# Patient Record
Sex: Female | Born: 1965 | Race: White | Hispanic: No | Marital: Single | Smoking: Never smoker
Health system: Southern US, Community
[De-identification: ages and names within clinical notes are randomized; demographics above are authoritative.]

## PROBLEM LIST (undated history)

## (undated) DIAGNOSIS — Z85850 Personal history of malignant neoplasm of thyroid: Secondary | ICD-10-CM

## (undated) HISTORY — PX: THYROID SURGERY: SHX805

## (undated) HISTORY — PX: TUBAL LIGATION: SHX77

---

## 2005-03-01 ENCOUNTER — Ambulatory Visit: Payer: Self-pay | Admitting: Obstetrics and Gynecology

## 2006-07-10 ENCOUNTER — Ambulatory Visit: Payer: Self-pay | Admitting: Otolaryngology

## 2006-08-05 ENCOUNTER — Inpatient Hospital Stay: Payer: Self-pay | Admitting: Otolaryngology

## 2006-08-26 ENCOUNTER — Inpatient Hospital Stay (HOSPITAL_COMMUNITY): Admission: AD | Admit: 2006-08-26 | Discharge: 2006-08-28 | Payer: Self-pay | Admitting: Endocrinology

## 2008-03-04 ENCOUNTER — Emergency Department: Payer: Self-pay | Admitting: Emergency Medicine

## 2008-03-12 ENCOUNTER — Ambulatory Visit: Payer: Self-pay

## 2008-04-19 ENCOUNTER — Ambulatory Visit: Payer: Self-pay | Admitting: Obstetrics and Gynecology

## 2010-11-05 ENCOUNTER — Ambulatory Visit: Payer: Self-pay | Admitting: Internal Medicine

## 2011-07-23 ENCOUNTER — Ambulatory Visit: Payer: Self-pay | Admitting: Obstetrics and Gynecology

## 2012-08-19 ENCOUNTER — Ambulatory Visit: Payer: Self-pay | Admitting: Anesthesiology

## 2012-08-27 ENCOUNTER — Ambulatory Visit: Payer: Self-pay | Admitting: Orthopedic Surgery

## 2012-10-14 ENCOUNTER — Ambulatory Visit: Payer: Self-pay | Admitting: Family Medicine

## 2012-10-14 LAB — DOT URINE DIP: Specific Gravity: 1.005 (ref 1.003–1.030)

## 2012-10-15 ENCOUNTER — Encounter: Payer: Self-pay | Admitting: Orthopedic Surgery

## 2012-11-03 ENCOUNTER — Encounter: Payer: Self-pay | Admitting: Orthopedic Surgery

## 2013-12-13 ENCOUNTER — Ambulatory Visit: Payer: Self-pay | Admitting: Emergency Medicine

## 2013-12-13 LAB — DOT URINE DIP
Blood: NEGATIVE
Glucose,UR: NEGATIVE mg/dL (ref 0–75)
PROTEIN: NEGATIVE
Specific Gravity: 1.005 (ref 1.003–1.030)

## 2014-08-26 NOTE — Op Note (Signed)
PATIENT NAME:  Christina Cross, Christina Cross MR#:  [id number] DATE OF BIRTH:  07-03-1965  DATE OF PROCEDURE:  08/27/2012  PREOPERATIVE DIAGNOSIS:  Right carpal tunnel syndrome.   POSTOPERATIVE DIAGNOSIS:  Right carpal tunnel syndrome.  PROCEDURE:  Right carpal tunnel release.   ANESTHESIA:  General.  SURGEON:  Leitha SchullerMichael J. Elizabethanne Lusher, MD  DESCRIPTION OF PROCEDURE:  The patient was brought to the Operating Room, and after adequate anesthesia was obtained, the right arm was prepped and draped in the usual sterile fashion, with a tourniquet applied to the upper arm. After prepping and draping in the usual sterile fashion, appropriate patient identification and timeout procedures were completed. The tourniquet was raised to 250 mmHg. An incision was made in line with the ring metacarpal. There was a hypoplastic thumb and some abnormal palmar creases, so the incision was made just in line with the ring metacarpal. The subcutaneous tissue was spread, and the distal end of the carpal tunnel was identified, a hemostat placed underneath it, and resection carried out proximally. The transcarpal ligament was very thick, especially in its midportion, which appeared to be the area of compression of about a 2 cm area, with a markedly thickened transverse carpal ligament. Getting to the wrist flexion crease, there did not appear to be any further compression in the antebrachial fascia. After adequate release, there appeared to be good vascular blush to the nerve. There were no masses within the carpal tunnel. The wound was irrigated and then closed with simple interrupted 4-0 nylon skin sutures, and 10 mL of 0.5% Sensorcaine without epinephrine was infiltrated for postop analgesia. Sterile dressings of Xeroform, 4 x 4s, Webril and Ace wrap were applied. The patient was sent to the Recovery Room in stable condition.   ESTIMATED BLOOD LOSS:  Minimal.   COMPLICATIONS:  None.   SPECIMEN:  None.   TOURNIQUET TIME:  14 minutes at 250  mmHg.    ____________________________ Leitha SchullerMichael J. Marialuiza Car, MD mjm:mr D: 08/27/2012 15:58:04 ET T: 08/27/2012 20:01:31 ET JOB#: [id number]  cc: Leitha SchullerMichael J. Isam Unrein, MD, <Dictator> Leitha SchullerMICHAEL J Yetunde Leis MD ELECTRONICALLY SIGNED 08/27/2012 21:10

## 2015-06-23 ENCOUNTER — Ambulatory Visit
Admission: EM | Admit: 2015-06-23 | Discharge: 2015-06-23 | Disposition: A | Discharge status: Home / Self Care | Payer: BLUE CROSS/BLUE SHIELD | Attending: Family Medicine | Admitting: Family Medicine

## 2015-06-23 ENCOUNTER — Encounter: Payer: Self-pay | Admitting: *Deleted

## 2015-06-23 DIAGNOSIS — J020 Streptococcal pharyngitis: Secondary | ICD-10-CM | POA: Diagnosis not present

## 2015-06-23 LAB — RAPID STREP SCREEN (MED CTR MEBANE ONLY): STREPTOCOCCUS, GROUP A SCREEN (DIRECT): POSITIVE — AB

## 2015-06-23 MED ORDER — HYDROCOD POLST-CPM POLST ER 10-8 MG/5ML PO SUER: 5.0000 mL | Freq: Two times a day (BID) | ORAL | Status: AC

## 2015-06-23 MED ORDER — PENICILLIN G BENZATHINE 1200000 UNIT/2ML IM SUSP
1.2000 10*6.[IU] | Freq: Once | INTRAMUSCULAR | Status: AC
  Administered 2015-06-23: 1.2 10*6.[IU] via INTRAMUSCULAR

## 2015-06-23 NOTE — ED Provider Notes (Signed)
CSN: [id number]     Arrival date & time 06/23/15  1955 History   First MD Initiated Contact with Patient 06/23/15 2232     Chief Complaint  Patient presents with  . Sore Throat  . Otalgia   (Consider location/radiation/quality/duration/timing/severity/associated sxs/prior Treatment) HPI    50 year old female who started having a sore throat pain 4 days ago with symptoms becoming worse on Tuesday and now having great difficulty swallowing. He has no fever or chills. She has had a headache and ear pain. She states that she has a history of strep throats. Works at a [employer] and is around children and great deal of the time.  History reviewed. No pertinent past medical history. Past Surgical History  Procedure Laterality Date  . Thyroid surgery    . Tubal ligation     History reviewed. No pertinent family history. Social History  Substance Use Topics  . Smoking status: Never Smoker   . Smokeless tobacco: Never Used  . Alcohol Use: Yes   OB History    No data available     Review of Systems  Constitutional: Negative for fever, chills, activity change and fatigue.  HENT: Positive for congestion, ear pain, postnasal drip, sore throat and trouble swallowing.   All other systems reviewed and are negative.   Allergies  Review of patient's allergies indicates no known allergies.  Home Medications   Prior to Admission medications   Medication Sig Start Date End Date Taking? Authorizing Provider  atomoxetine (STRATTERA) 40 MG capsule Take 40 mg by mouth daily.   Yes Historical Provider, MD  levothyroxine (SYNTHROID, LEVOTHROID) 100 MCG tablet Take 100 mcg by mouth daily before breakfast.   Yes Historical Provider, MD  chlorpheniramine-HYDROcodone (TUSSIONEX PENNKINETIC ER) 10-8 MG/5ML SUER Take 5 mLs by mouth 2 (two) times daily. 06/23/15   Lutricia Feil, PA-C   Meds Ordered and Administered this Visit   Medications  penicillin g benzathine (BICILLIN LA) 1200000  UNIT/2ML injection 1.2 Million Units (1.2 Million Units Intramuscular Given 06/23/15 2217)    BP 144/81 mmHg  Pulse 76  Temp(Src) 98 F (36.7 C) (Oral)  Resp 18  Ht  (1.575 m)  Wt 133 lb (60.328 kg)  BMI 24.32 kg/m2  SpO2 98% No data found.   Physical Exam  Constitutional: She is oriented to person, place, and time. She appears well-developed and well-nourished. No distress.  HENT:  Head: Normocephalic and atraumatic.  Right Ear: External ear normal.  Left Ear: External ear normal.  Nose: Nose normal.  Mouth/Throat: Oropharynx is clear and moist.  Pharynx shows erythema with some cobblestoning of her tonsils. No exudate is seen.  Eyes: Conjunctivae are normal. Pupils are equal, round, and reactive to light.  Neck: Normal range of motion. Neck supple.  Pulmonary/Chest: Effort normal and breath sounds normal. No respiratory distress. She has no wheezes. She has no rales.  Musculoskeletal: Normal range of motion. She exhibits no edema or tenderness.  Lymphadenopathy:    She has no cervical adenopathy.  Neurological: She is alert and oriented to person, place, and time.  Skin: Skin is warm and dry. She is not diaphoretic.  Psychiatric: She has a normal mood and affect. Her behavior is normal. Judgment and thought content normal.  Nursing note and vitals reviewed.   ED Course  Procedures (including critical care time)  Labs Review Labs Reviewed  RAPID STREP SCREEN (NOT AT [facility]) - Abnormal; Notable for the following:    Streptococcus, Group A Screen (  Direct) POSITIVE (*)    All other components within normal limits    Imaging Review No results found.   Visual Acuity Review  Right Eye Distance:   Left Eye Distance:   Bilateral Distance:    Right Eye Near:   Left Eye Near:    Bilateral Near:    22:17 Medication Given KF  penicillin g benzathine (BICILLIN LA) 1200000 UNIT/2ML injection 1.2 Million Units - Dose: 1.2 Million Units ; Route: Intramuscular ; Site:  Left Upper Outer Quadrant ; Scheduled Time: 2215          MDM   1. Strep pharyngitis    Discharge Medication List as of 06/23/2015 10:34 PM     Plan: 1. Test/x-ray results and diagnosis reviewed with patient 2. rx as per orders; risks, benefits, potential side effects reviewed with patient 3. Recommend supportive treatment with water gargles as necessary. I have given her prescription for Tussionex because of her nighttime coughing that is keeping her her spouse awake. She opted for an IM injection of the LA Bicillin. No proof of cure will be necessarily unless she continues to have a sore throat. In that case she should follow-up with her primary care 4. F/u prn if symptoms worsen or don't improve    Lutricia Feil, PA-C 06/23/15 2246

## 2015-06-23 NOTE — Discharge Instructions (Signed)
Rapid Strep Test Strep throat is a bacterial infection caused by the bacteria Streptococcus pyogenes. A rapid strep test is the quickest way to check if these bacteria are causing your sore throat. The test can be done at your health care provider's office. Results are usually ready in 10-20 minutes. You may have this test if you have symptoms of strep throat. These include:   A red throat with yellow or white spots.  Neck swelling and tenderness.  Fever.  Loss of appetite.  Trouble breathing or swallowing.  Rash.  Dehydration. This test requires a sample of fluid from the back of your throat and tonsils. Your health care provider may hold down your tongue with a tongue depressor and use a swab to collect the sample.  Your health care provider may collect a second sample at the same time. The second sample may be used for a throat culture. In a culture test, the sample is combined with a substance that encourages bacteria to grow. It takes longer to get the results of the throat culture test, but they are more accurate. They can confirm the results from a rapid strep test, or show that those results were wrong. RESULTS  It is your responsibility to obtain your test results. Ask the lab or department performing the test when and how you will get your results. Contact your health care provider to discuss any questions you have about your results.  The results of the rapid strep test will be negative or positive.  Meaning of Negative Test Results If the result of your rapid strep test is negative, then it means:   It is likely that you do not have strep throat.  A virus may be causing your sore throat. Your health care provider may do a throat culture to confirm the results of the rapid strep test. The throat culture can also identify the different strains of strep bacteria. Meaning of Positive Test Results If the result of your rapid strep test is positive, then it means:  It is likely  that you do have strep throat.  You may have to take antibiotics. Your health care provider may do a throat culture to confirm the results of the rapid strep test. Strep throat usually requires a course of antibiotics.    This information is not intended to replace advice given to you by your health care provider. Make sure you discuss any questions you have with your health care provider.   Document Released: 05/30/2004 Document Revised: 05/13/2014 Document Reviewed: 07/29/2013 Elsevier Interactive Patient Education 2016 Elsevier Inc.  Pharyngitis Pharyngitis is redness, pain, and swelling (inflammation) of your pharynx.  CAUSES  Pharyngitis is usually caused by infection. Most of the time, these infections are from viruses (viral) and are part of a cold. However, sometimes pharyngitis is caused by bacteria (bacterial). Pharyngitis can also be caused by allergies. Viral pharyngitis may be spread from person to person by coughing, sneezing, and personal items or utensils (cups, forks, spoons, toothbrushes). Bacterial pharyngitis may be spread from person to person by more intimate contact, such as kissing.  SIGNS AND SYMPTOMS  Symptoms of pharyngitis include:   Sore throat.   Tiredness (fatigue).   Low-grade fever.   Headache.  Joint pain and muscle aches.  Skin rashes.  Swollen lymph nodes.  Plaque-like film on throat or tonsils (often seen with bacterial pharyngitis). DIAGNOSIS  Your health care provider will ask you questions about your illness and your symptoms. Your medical history, along with  a physical exam, is often all that is needed to diagnose pharyngitis. Sometimes, a rapid strep test is done. Other lab tests may also be done, depending on the suspected cause.  TREATMENT  Viral pharyngitis will usually get better in 3-4 days without the use of medicine. Bacterial pharyngitis is treated with medicines that kill germs (antibiotics).  HOME CARE INSTRUCTIONS   Drink  enough water and fluids to keep your urine clear or pale yellow.   Only take over-the-counter or prescription medicines as directed by your health care provider:   If you are prescribed antibiotics, make sure you finish them even if you start to feel better.   Do not take aspirin.   Get lots of rest.   Gargle with 8 oz of salt water ( tsp of salt per 1 qt of water) as often as every 1-2 hours to soothe your throat.   Throat lozenges (if you are not at risk for choking) or sprays may be used to soothe your throat. SEEK MEDICAL CARE IF:   You have large, tender lumps in your neck.  You have a rash.  You cough up green, yellow-brown, or bloody spit. SEEK IMMEDIATE MEDICAL CARE IF:   Your neck becomes stiff.  You drool or are unable to swallow liquids.  You vomit or are unable to keep medicines or liquids down.  You have severe pain that does not go away with the use of recommended medicines.  You have trouble breathing (not caused by a stuffy nose). MAKE SURE YOU:   Understand these instructions.  Will watch your condition.  Will get help right away if you are not doing well or get worse.   This information is not intended to replace advice given to you by your health care provider. Make sure you discuss any questions you have with your health care provider.   Document Released: 04/22/2005 Document Revised: 02/10/2013 Document Reviewed: 12/28/2012 Elsevier Interactive Patient Education [employer]2016 Elsevier Inc.

## 2015-06-23 NOTE — ED Notes (Signed)
Patient started having sore throat pain on this past Monday with symptoms becoming worse on Tuesday. Additional symptoms of headache and ear pain occurred recently. Patient has a history of strep throat.

## 2015-06-30 ENCOUNTER — Ambulatory Visit
Admission: EM | Admit: 2015-06-30 | Discharge: 2015-06-30 | Disposition: A | Discharge status: Home / Self Care | Payer: BLUE CROSS/BLUE SHIELD | Attending: Family Medicine | Admitting: Family Medicine

## 2015-06-30 DIAGNOSIS — J020 Streptococcal pharyngitis: Secondary | ICD-10-CM

## 2015-06-30 MED ORDER — PENICILLIN V POTASSIUM 500 MG PO TABS: 500.0000 mg | ORAL_TABLET | Freq: Three times a day (TID) | ORAL | Status: AC

## 2015-06-30 NOTE — ED Provider Notes (Signed)
CSN: [id number]     Arrival date & time 06/30/15  1744 History   First MD Initiated Contact with Patient 06/30/15 1914     Chief Complaint  Patient presents with  . Sore Throat   (Consider location/radiation/quality/duration/timing/severity/associated sxs/prior Treatment) Patient is a 50 y.o. female presenting with pharyngitis. The history is provided by the patient.  Sore Throat This is a new problem. The current episode started more than 1 week ago. The problem occurs constantly. The problem has been resolved (patient seen last week with similar and positive strep test; given penicillin injection; symptoms improved but not resolved).    History reviewed. No pertinent past medical history. Past Surgical History  Procedure Laterality Date  . Thyroid surgery    . Tubal ligation     History reviewed. No pertinent family history. Social History  Substance Use Topics  . Smoking status: Never Smoker   . Smokeless tobacco: Never Used  . Alcohol Use: Yes   OB History    No data available     Review of Systems  Allergies  Review of patient's allergies indicates no known allergies.  Home Medications   Prior to Admission medications   Medication Sig Start Date End Date Taking? Authorizing Provider  atomoxetine (STRATTERA) 40 MG capsule Take 40 mg by mouth daily.   Yes Historical Provider, MD  chlorpheniramine-HYDROcodone (TUSSIONEX PENNKINETIC ER) 10-8 MG/5ML SUER Take 5 mLs by mouth 2 (two) times daily. 06/23/15  Yes Lutricia Feil, PA-C  levothyroxine (SYNTHROID, LEVOTHROID) 100 MCG tablet Take 100 mcg by mouth daily before breakfast.   Yes Historical Provider, MD  penicillin v potassium (VEETID) 500 MG tablet Take 1 tablet (500 mg total) by mouth 3 (three) times daily. 06/30/15   Payton Mccallum, MD   Meds Ordered and Administered this Visit  Medications - No data to display  BP 142/60 mmHg  Pulse 72  Temp(Src) 98.2 F (36.8 C) (Oral)  Resp 16  Ht  (1.575 m)  Wt 133  lb (60.328 kg)  BMI 24.32 kg/m2  SpO2 99% No data found.   Physical Exam  Constitutional: She appears well-developed and well-nourished. No distress.  HENT:  Head: Normocephalic and atraumatic.  Right Ear: Tympanic membrane, external ear and ear canal normal.  Left Ear: Tympanic membrane, external ear and ear canal normal.  Nose: Mucosal edema and rhinorrhea present. No nose lacerations, sinus tenderness, nasal deformity, septal deviation or nasal septal hematoma. No epistaxis.  No foreign bodies. Right sinus exhibits maxillary sinus tenderness and frontal sinus tenderness. Left sinus exhibits maxillary sinus tenderness and frontal sinus tenderness.  Mouth/Throat: Uvula is midline and mucous membranes are normal. Posterior oropharyngeal erythema present. No oropharyngeal exudate, posterior oropharyngeal edema or tonsillar abscesses.  Eyes: Conjunctivae and EOM are normal. Pupils are equal, round, and reactive to light. Right eye exhibits no discharge. Left eye exhibits no discharge. No scleral icterus.  Neck: Normal range of motion. Neck supple. No thyromegaly present.  Cardiovascular: Normal rate, regular rhythm and normal heart sounds.   Pulmonary/Chest: Effort normal and breath sounds normal. No respiratory distress. She has no wheezes. She has no rales.  Lymphadenopathy:    She has no cervical adenopathy.  Skin: She is not diaphoretic.  Nursing note and vitals reviewed.   ED Course  Procedures (including critical care time)  Labs Review Labs Reviewed - No data to display  Imaging Review No results found.   Visual Acuity Review  Right Eye Distance:   Left Eye Distance:  Bilateral Distance:    Right Eye Near:   Left Eye Near:    Bilateral Near:         MDM   1. Strep pharyngitis    Discharge Medication List as of 06/30/2015  7:23 PM    START taking these medications   Details  penicillin v potassium (VEETID) 500 MG tablet Take 1 tablet (500 mg total) by mouth  3 (three) times daily., Starting 06/30/2015, Until Discontinued, Normal        1.  diagnosis reviewed with patient 2. rx as per orders above; reviewed possible side effects, interactions, risks and benefits  3. Recommend supportive treatment with otc analgesics 4. Follow-up prn if symptoms worsen or don't improve    Payton Mccallum, MD 06/30/15 [phone]

## 2015-06-30 NOTE — ED Notes (Signed)
Patient was seen in our office by Ovid Curd, PA-C last Friday and was diagnosed with Strep Throat A.  A shot of PCN was given in her hip last Friday, but it did not give her much relief.  She states that the left side of her throat still is painful, but the right is not.

## 2015-09-19 ENCOUNTER — Emergency Department: Payer: BLUE CROSS/BLUE SHIELD

## 2015-09-19 ENCOUNTER — Emergency Department
Admission: EM | Admit: 2015-09-19 | Discharge: 2015-09-19 | Disposition: A | Discharge status: Home / Self Care | Payer: BLUE CROSS/BLUE SHIELD | Attending: Emergency Medicine | Admitting: Emergency Medicine

## 2015-09-19 DIAGNOSIS — Y939 Activity, unspecified: Secondary | ICD-10-CM | POA: Insufficient documentation

## 2015-09-19 DIAGNOSIS — Y999 Unspecified external cause status: Secondary | ICD-10-CM | POA: Insufficient documentation

## 2015-09-19 DIAGNOSIS — S63502A Unspecified sprain of left wrist, initial encounter: Secondary | ICD-10-CM | POA: Insufficient documentation

## 2015-09-19 DIAGNOSIS — S56912A Strain of unspecified muscles, fascia and tendons at forearm level, left arm, initial encounter: Secondary | ICD-10-CM | POA: Diagnosis not present

## 2015-09-19 DIAGNOSIS — Y929 Unspecified place or not applicable: Secondary | ICD-10-CM | POA: Diagnosis not present

## 2015-09-19 DIAGNOSIS — W010XXA Fall on same level from slipping, tripping and stumbling without subsequent striking against object, initial encounter: Secondary | ICD-10-CM | POA: Diagnosis not present

## 2015-09-19 DIAGNOSIS — Z79899 Other long term (current) drug therapy: Secondary | ICD-10-CM | POA: Insufficient documentation

## 2015-09-19 DIAGNOSIS — S6992XA Unspecified injury of left wrist, hand and finger(s), initial encounter: Secondary | ICD-10-CM | POA: Diagnosis present

## 2015-09-19 NOTE — ED Notes (Signed)
Patient ambulatory to triage with steady gait, without difficulty or distress noted; pt reports PTA fell injuring left wrist; ice pack in place

## 2015-09-19 NOTE — Discharge Instructions (Signed)
Cryotherapy Cryotherapy is when you put ice on your injury. Ice helps lessen pain and puffiness (swelling) after an injury. Ice works the best when you start using it in the first 24 to 48 hours after an injury. HOME CARE  Put a dry or damp towel between the ice pack and your skin.  You may press gently on the ice pack.  Leave the ice on for no more than 10 to 20 minutes at a time.  Check your skin after 5 minutes to make sure your skin is okay.  Rest at least 20 minutes between ice pack uses.  Stop using ice when your skin loses feeling (numbness).  Do not use ice on someone who cannot tell you when it hurts. This includes small children and people with memory problems (dementia). GET HELP RIGHT AWAY IF:  You have white spots on your skin.  Your skin turns blue or pale.  Your skin feels waxy or hard.  Your puffiness gets worse. MAKE SURE YOU:   Understand these instructions.  Will watch your condition.  Will get help right away if you are not doing well or get worse.   This information is not intended to replace advice given to you by your health care provider. Make sure you discuss any questions you have with your health care provider.   Document Released: 10/09/2007 Document Revised: 07/15/2011 Document Reviewed: 12/13/2010 Elsevier Interactive Patient Education 2016 Elsevier Inc.   Wrist Sprain A wrist sprain is a stretch or tear in the strong, fibrous tissues (ligaments) that connect your wrist bones. The ligaments of your wrist may be easily sprained. There are three types of wrist sprains.  Grade 1. The ligament is not stretched or torn, but the sprain causes pain.  Grade 2. The ligament is stretched or partially torn. You may be able to move your wrist, but not very much.  Grade 3. The ligament or muscle completely tears. You may find it difficult or extremely painful to move your wrist even a little. CAUSES Often, wrist sprains are a result of a fall or an  injury. The force of the impact causes the fibers of your ligament to stretch too much or tear. Common causes of wrist sprains include:  Overextending your wrist while catching a ball with your hands.  Repetitive or strenuous extension or bending of your wrist.  Landing on your hand during a fall. RISK FACTORS  Having previous wrist injuries.  Playing contact sports, such as boxing or wrestling.  Participating in activities in which falling is common.  Having poor wrist strength and flexibility. SIGNS AND SYMPTOMS  Wrist pain.  Wrist tenderness.  Inflammation or bruising of the wrist area.  Hearing a "pop" or feeling a tear at the time of the injury.  Decreased wrist movement due to pain, stiffness, or weakness. DIAGNOSIS Your health care provider will examine your wrist. In some cases, an X-ray will be taken to make sure you did not break any bones. If your health care provider thinks that you tore a ligament, he or she may order an MRI of your wrist. TREATMENT Treatment involves resting and icing your wrist. You may also need to take pain medicines to help lessen pain and inflammation. Your health care provider may recommend keeping your wrist still (immobilized) with a splint to help your sprain heal. When the splint is no longer necessary, you may need to perform strengthening and stretching exercises. These exercises help you to regain strength and full range  of motion in your wrist. Surgery is not usually needed for wrist sprains unless the ligament completely tears. HOME CARE INSTRUCTIONS  Rest your wrist. Do not do things that cause pain.  Wear your wrist splint as directed by your health care provider.  Take medicines only as directed by your health care provider.  To ease pain and swelling, apply ice to the injured area.  Put ice in a plastic bag.  Place a towel between your skin and the bag.  Leave the ice on for 20 minutes, 2-3 times a day. SEEK MEDICAL CARE  IF:  Your pain, discomfort, or swelling gets worse even with treatment.  You feel sudden numbness in your hand.   This information is not intended to replace advice given to you by your health care provider. Make sure you discuss any questions you have with your health care provider.   Document Released: 12/24/2013 Document Reviewed: 12/24/2013 Elsevier Interactive Patient Education [employer].   Your exam and x-ray are normal and do not show any fracture or dislocation. Follow-up with your provider or Dr. Rosita KeaMenz for any ongoing symptoms. Take ibuprofen as needed for pain.

## 2015-09-19 NOTE — ED Provider Notes (Signed)
[facility] Emergency Department Provider Note ____________________________________________  Time seen: 2125  I have reviewed the triage vital signs and the nursing notes.  HISTORY  Chief Complaint  Wrist Pain  HPI Christina Cross is a 50 y.o. female presents to the ED for evaluation of pain to her left wrist and forearm after she fell backwards off of a fence. The patient describes slipping on the sense that she was sitting on, falling backwards on outstretched left arm. She denies any significant injury to her back or head. She presents to the ED with pain to the dorsal aspect of the forearm.She rates her pain at a 6/10 in triage.  No past medical history on file.  There are no active problems to display for this patient.   Past Surgical History  Procedure Laterality Date  . Thyroid surgery    . Tubal ligation      Current Outpatient Rx  Name  Route  Sig  Dispense  Refill  . atomoxetine (STRATTERA) 40 MG capsule   Oral   Take 40 mg by mouth daily.         . chlorpheniramine-HYDROcodone (TUSSIONEX PENNKINETIC ER) 10-8 MG/5ML SUER   Oral   Take 5 mLs by mouth 2 (two) times daily.   115 mL   0   . levothyroxine (SYNTHROID, LEVOTHROID) 100 MCG tablet   Oral   Take 100 mcg by mouth daily before breakfast.         . penicillin v potassium (VEETID) 500 MG tablet   Oral   Take 1 tablet (500 mg total) by mouth 3 (three) times daily.   30 tablet   0     Allergies Review of patient's allergies indicates no known allergies.  No family history on file.  Social History Social History  Substance Use Topics  . Smoking status: Never Smoker   . Smokeless tobacco: Never Used  . Alcohol Use: Yes    Review of Systems  Constitutional: Negative for fever. Musculoskeletal: Negative for back pain. Left wrist & forearm  Skin: Negative for rash. Neurological: Negative for headaches, focal weakness or  numbness. ____________________________________________  PHYSICAL EXAM:  VITAL SIGNS: ED Triage Vitals  Enc Vitals Group     BP 09/19/15 2029 138/81 mmHg     Pulse Rate 09/19/15 2029 62     Resp 09/19/15 2029 18     Temp 09/19/15 2029 97.9 F (36.6 C)     Temp Source 09/19/15 2029 Oral     SpO2 09/19/15 2029 98 %     Weight 09/19/15 2029 138 lb (62.596 kg)     Height 09/19/15 2029  (1.575 m)     Head Cir --      Peak Flow --      Pain Score 09/19/15 2102 6     Pain Loc --      Pain Edu? --      Excl. in GC? --    Constitutional: Alert and oriented. Well appearing and in no distress. Head: Normocephalic and atraumatic. Cardiovascular: Normal distal pulses and cap refill Respiratory: Normal respiratory effort.  Musculoskeletal: Left wrist and forearm without obvious deformity, effusion, or swelling. Patient with normal composite fist and normal wrist range of motion. She also has normal elbow exam. She is minimally tender to palpation over the extensor forearm musculature of the mid forearm. No obvious abrasion, ecchymosis, or soft tissue swelling is appreciated. Nontender with normal range of motion in all extremities.  Neurologic:  Cranial nerves II through XII grossly intact. Normal intrinsic and opposition testing noted. Normal gait without ataxia. Normal speech and language. No gross focal neurologic deficits are appreciated. Skin:  Skin is warm, dry and intact. No rash noted. ____________________________________________   RADIOLOGY Left Wrist IMPRESSION: Negative.  I, Erikson Danzy, Charlesetta IvoryJenise V Bacon, personally viewed and evaluated these images (plain radiographs) as part of my medical decision making, as well as reviewing the written report by the radiologist. ____________________________________________  PROCEDURES  Ice pack ____________________________________________  INITIAL IMPRESSION / ASSESSMENT AND PLAN / ED COURSE  Patient with a sprained left wrist and  forearm without radiologic evidence of fracture or dislocation. She will be discharged with instructions on rest, ice, and compression. She is advised to take Tylenol or Motrin as needed for pain relief. She should follow with a primary care provider or Dr. Martha ClanKrasinski for ongoing symptom management. ____________________________________________  FINAL CLINICAL IMPRESSION(S) / ED DIAGNOSES  Final diagnoses:  Wrist sprain, left, initial encounter  Forearm strain, left, initial encounter      Lissa HoardJenise V Bacon Rashaun Wichert, PA-C 09/20/15 0012  Emily FilbertJonathan E Williams, MD 09/22/15 1309

## 2017-10-15 ENCOUNTER — Encounter: Payer: Self-pay | Admitting: *Deleted

## 2019-03-18 ENCOUNTER — Emergency Department
Admission: EM | Admit: 2019-03-18 | Discharge: 2019-03-18 | Disposition: A | Discharge status: Home / Self Care | Payer: BC Managed Care – PPO | Attending: Emergency Medicine | Admitting: Emergency Medicine

## 2019-03-18 ENCOUNTER — Encounter: Payer: Self-pay | Admitting: Emergency Medicine

## 2019-03-18 ENCOUNTER — Other Ambulatory Visit: Payer: Self-pay

## 2019-03-18 ENCOUNTER — Emergency Department: Payer: BC Managed Care – PPO

## 2019-03-18 DIAGNOSIS — R079 Chest pain, unspecified: Secondary | ICD-10-CM | POA: Diagnosis not present

## 2019-03-18 DIAGNOSIS — Z85850 Personal history of malignant neoplasm of thyroid: Secondary | ICD-10-CM | POA: Diagnosis not present

## 2019-03-18 HISTORY — DX: Personal history of malignant neoplasm of thyroid: Z85.850

## 2019-03-18 LAB — BASIC METABOLIC PANEL
Anion gap: 10 (ref 5–15)
BUN: 19 mg/dL (ref 6–20)
CO2: 28 mmol/L (ref 22–32)
Calcium: 9.9 mg/dL (ref 8.9–10.3)
Chloride: 103 mmol/L (ref 98–111)
Creatinine, Ser: 0.89 mg/dL (ref 0.44–1.00)
GFR calc Af Amer: >60 mL/min (ref >60)
GFR calc non Af Amer: >60 mL/min (ref >60)
Glucose, Bld: 94 mg/dL (ref 70–99)
Potassium: 3.8 mmol/L (ref 3.5–5.1)
Sodium: 141 mmol/L (ref 135–145)

## 2019-03-18 LAB — CBC
HCT: 42.4 % (ref 36.0–46.0)
Hemoglobin: 14.4 g/dL (ref 12.0–15.0)
MCH: 29.3 pg (ref 26.0–34.0)
MCHC: 34 g/dL (ref 30.0–36.0)
MCV: 86.4 fL (ref 80.0–100.0)
Platelets: 212 10*3/uL (ref 150–400)
RBC: 4.91 MIL/uL (ref 3.87–5.11)
RDW: 12.6 % (ref 11.5–15.5)
WBC: 7.2 10*3/uL (ref 4.0–10.5)
nRBC: 0 % (ref 0.0–0.2)

## 2019-03-18 LAB — TROPONIN I (HIGH SENSITIVITY)
Troponin I (High Sensitivity): 3 ng/L (ref <18)
Troponin I (High Sensitivity): 3 ng/L (ref <18)

## 2019-03-18 MED ORDER — FAMOTIDINE 20 MG PO TABS: 20.0000 mg | ORAL_TABLET | Freq: Two times a day (BID) | ORAL | 1 refills | Status: AC

## 2019-03-18 NOTE — ED Provider Notes (Addendum)
[facility] Emergency Department Provider Note       Time seen: ----------------------------------------- 9:17 PM on 03/18/2019 -----------------------------------------   I have reviewed the triage vital signs and the nursing notes.  HISTORY   Chief Complaint Chest Pain    HPI Christina Cross is a 53 y.o. female with no significant past medical history who presents to the ED for intermittent chest pain since last Wednesday.  Patient has not any cough or fever.  Patient states pain started on the left but today was on the right.  Currently 1 out of 10.  Past Medical History:  Diagnosis Date  . History of papillary adenocarcinoma of thyroid     There are no active problems to display for this patient.   Past Surgical History:  Procedure Laterality Date  . CARPAL TUNNEL RELEASE    . THYROID SURGERY    . THYROIDECTOMY    . TUBAL LIGATION      Allergies Patient has no known allergies.  Social History Social History   Tobacco Use  . Smoking status: Never Smoker  . Smokeless tobacco: Never Used  Substance Use Topics  . Alcohol use: Yes  . Drug use: No   Review of Systems Constitutional: Negative for fever. Cardiovascular: Positive for chest pain Respiratory: Negative for shortness of breath. Gastrointestinal: Negative for abdominal pain, vomiting and diarrhea. Musculoskeletal: Negative for back pain. Skin: Negative for rash. Neurological: Negative for headaches, focal weakness or numbness.  All systems negative/normal/unremarkable except as stated in the HPI  ____________________________________________   PHYSICAL EXAM:  VITAL SIGNS: ED Triage Vitals  Enc Vitals Group     BP --      Pulse --      Resp --      Temp 03/18/19 1835 99.1 F (37.3 C)     Temp Source 03/18/19 1835 Oral     SpO2 --      Weight 03/18/19 1832 140 lb (63.5 kg)     Height 03/18/19 1832 5\' 2"  (1.575 m)     Head Circumference --      Peak Flow --       Pain Score 03/18/19 1832 1     Pain Loc --      Pain Edu? --      Excl. in GC? --     Constitutional: Alert and oriented. Well appearing and in no distress. Eyes: Conjunctivae are normal. Normal extraocular movements. ENT      Head: Normocephalic and atraumatic.      Nose: No congestion/rhinnorhea.      Mouth/Throat: Mucous membranes are moist.      Neck: No stridor. Cardiovascular: Normal rate, regular rhythm. No murmurs, rubs, or gallops. Respiratory: Normal respiratory effort without tachypnea nor retractions. Breath sounds are clear and equal bilaterally. No wheezes/rales/rhonchi. Gastrointestinal: Soft and nontender. Normal bowel sounds Musculoskeletal: Nontender with normal range of motion in extremities. No lower extremity tenderness nor edema. Neurologic:  Normal speech and language. No gross focal neurologic deficits are appreciated.  Skin:  Skin is warm, dry and intact. No rash noted. Psychiatric: Mood and affect are normal. Speech and behavior are normal.  ____________________________________________  EKG: Interpreted by me.  Sinus bradycardia with a rate of 59 bpm, low voltage, normal axis, normal QT  ____________________________________________  ED COURSE:  As part of my medical decision making, I reviewed the following data within the electronic MEDICAL RECORD NUMBER History obtained from family if available, nursing notes, old chart and ekg, as well  as notes from prior ED visits. Patient presented for chest pain, we will assess with labs and imaging as indicated at this time.   Procedures  Christina Cross was evaluated in Emergency Department on 03/18/2019 for the symptoms described in the history of present illness. She was evaluated in the context of the global COVID-19 pandemic, which necessitated consideration that the patient might be at risk for infection with the SARS-CoV-2 virus that causes COVID-19. Institutional protocols and algorithms that pertain to the  evaluation of patients at risk for COVID-19 are in a state of rapid change based on information released by regulatory bodies including the CDC and federal and state organizations. These policies and algorithms were followed during the patient's care in the ED.  ____________________________________________   LABS (pertinent positives/negatives)  Labs Reviewed  BASIC METABOLIC PANEL  CBC  TROPONIN I (HIGH SENSITIVITY)  TROPONIN I (HIGH SENSITIVITY)    RADIOLOGY  Chest x-ray IMPRESSION: Normal radiographs of the chest. ____________________________________________   DIFFERENTIAL DIAGNOSIS   Musculoskeletal pain, GERD, anxiety, unstable angina, PE, pneumothorax  FINAL ASSESSMENT AND PLAN  Chest pain   Plan: The patient had presented for nonspecific chest pain. Patient's labs were reassuring including repeat troponins. Patient's imaging did not reveal any acute process. Her symptoms more resemble stress and acid reflux. She is cleared for outpatient follow-up with cardiology for stress testing.   Laurence Aly, MD    Note: This note was generated in part or whole with voice recognition software. Voice recognition is usually quite accurate but there are transcription errors that can and very often do occur. I apologize for any typographical errors that were not detected and corrected.     Earleen Newport, MD 03/18/19 2119    Earleen Newport, MD 03/18/19 2129

## 2019-03-18 NOTE — ED Triage Notes (Signed)
Here for intermittent chest pain since last Wednesday.  NAD. VSS. Unlabored.  No cough or fever.  ambulatory without difficulty. Pain started on left but today was on right.  Pain 1/10 right now.

## 2019-03-18 NOTE — ED Notes (Signed)
Dr. Williams at the bedside for pt evaluation  

## 2020-03-18 ENCOUNTER — Other Ambulatory Visit: Payer: Self-pay | Admitting: Emergency Medicine

## 2021-02-15 IMAGING — CR DG CHEST 2V
2 series · 2 of 2 positions shown · non-contrast
Comparison: 03/05/2008

CLINICAL DATA: Chest pain.

EXAM:
CHEST - 2 VIEW

[chest pa]
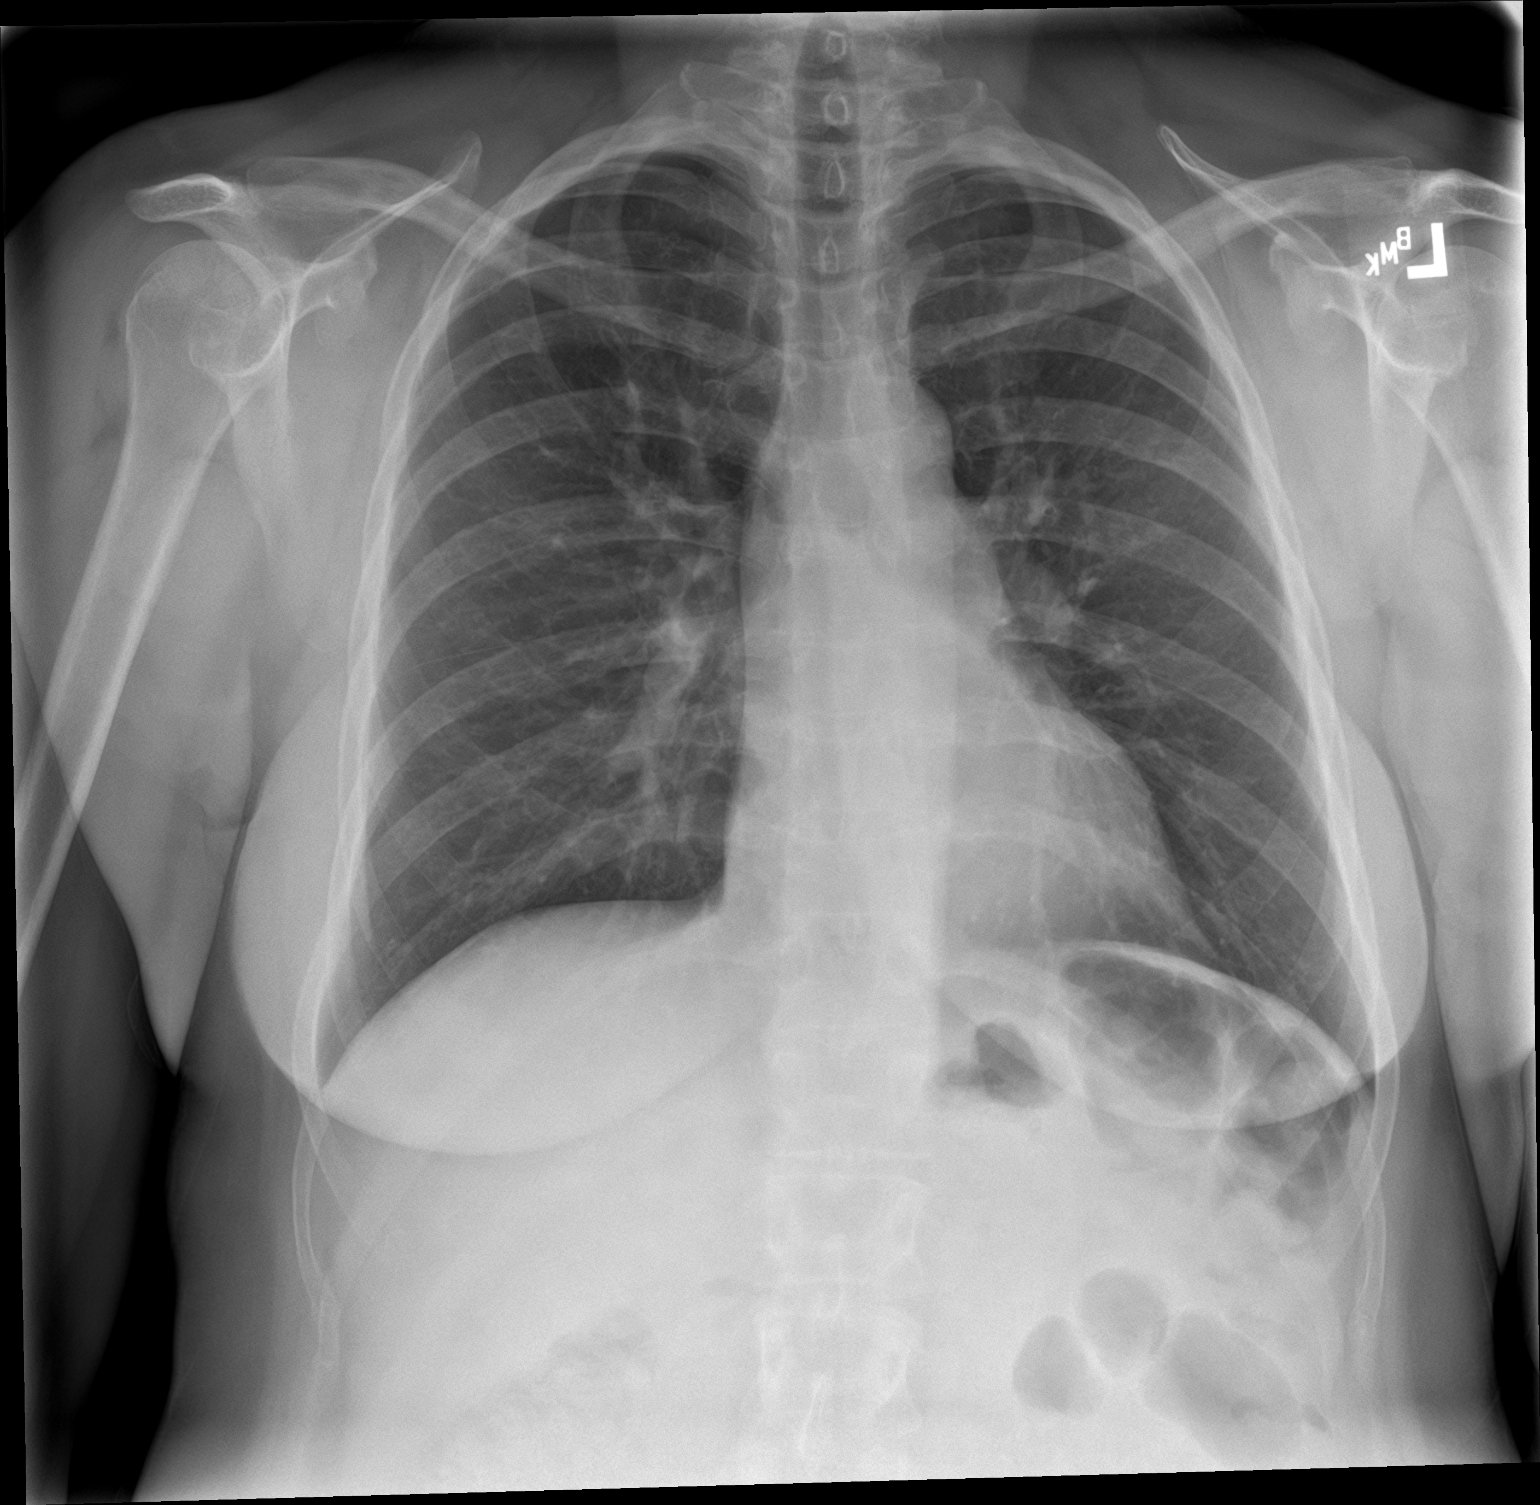

[chest lat]
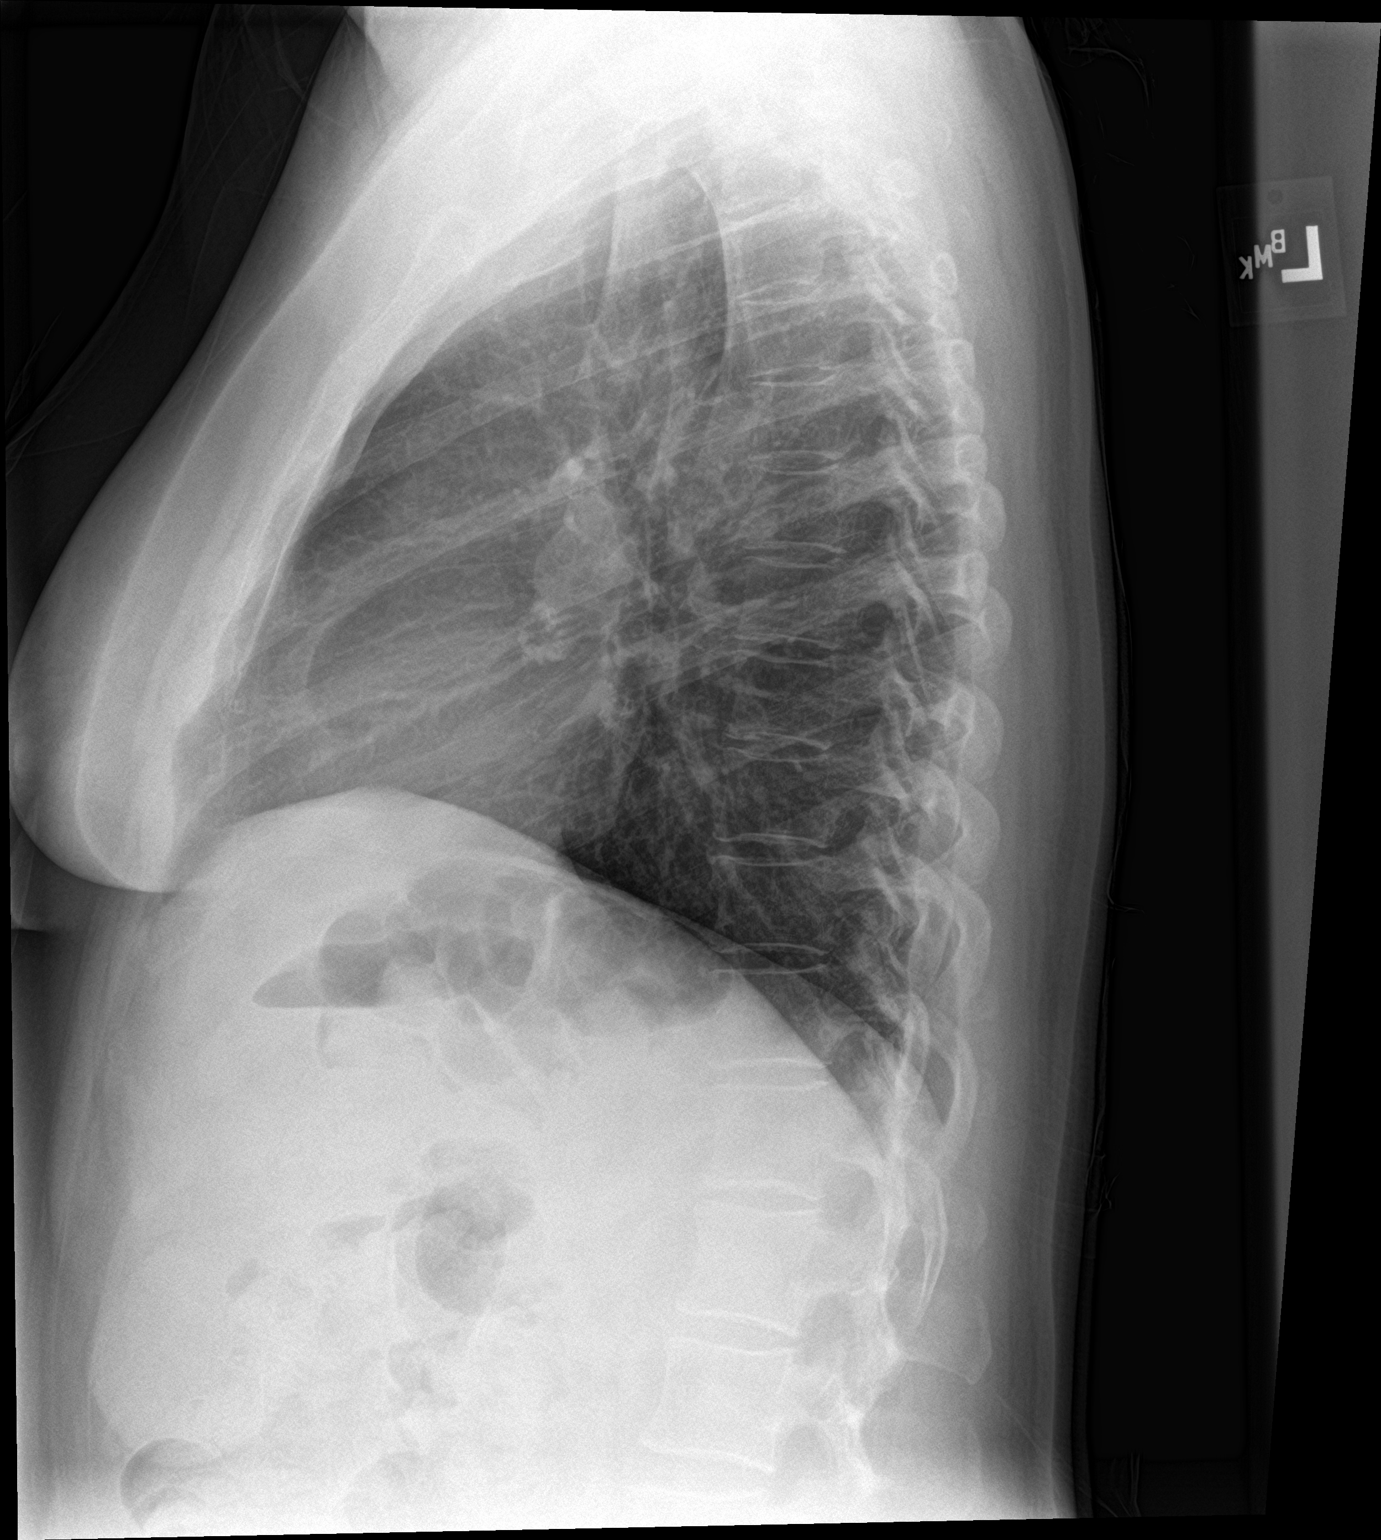

[2 of 2 positions shown; findings below may reference images not displayed]

FINDINGS: The cardiomediastinal contours are normal. The lungs are clear.
Pulmonary vasculature is normal. No consolidation, pleural effusion,
or pneumothorax. No acute osseous abnormalities are seen.
IMPRESSION: Normal radiographs of the chest.
